# Patient Record
Sex: Female | Born: 1990 | Race: Black or African American | Hispanic: No | Marital: Single | Smoking: Current every day smoker
Health system: Southern US, Community
[De-identification: ages and names within clinical notes are randomized; demographics above are authoritative.]

---

## 2008-12-27 ENCOUNTER — Inpatient Hospital Stay (HOSPITAL_COMMUNITY): Admission: AD | Admit: 2008-12-27 | Discharge: 2008-12-27 | Payer: Self-pay | Admitting: Obstetrics & Gynecology

## 2011-04-01 LAB — WET PREP, GENITAL: Clue Cells Wet Prep HPF POC: NONE SEEN

## 2011-04-01 LAB — CBC
HCT: 37.5 % (ref 36.0–46.0)
Hemoglobin: 12.6 g/dL (ref 12.0–15.0)
MCHC: 33.7 g/dL (ref 30.0–36.0)
MCV: 92.7 fL (ref 78.0–100.0)
Platelets: 342 10*3/uL (ref 150–400)
RDW: 12.3 % (ref 11.5–15.5)

## 2011-04-01 LAB — GC/CHLAMYDIA PROBE AMP, GENITAL: GC Probe Amp, Genital: NEGATIVE

## 2012-04-18 ENCOUNTER — Encounter (HOSPITAL_COMMUNITY): Payer: Self-pay | Admitting: *Deleted

## 2012-04-18 ENCOUNTER — Emergency Department (HOSPITAL_COMMUNITY)
Admission: EM | Admit: 2012-04-18 | Discharge: 2012-04-18 | Disposition: A | Discharge status: Home / Self Care | Payer: No Typology Code available for payment source | Attending: Emergency Medicine | Admitting: Emergency Medicine

## 2012-04-18 DIAGNOSIS — K089 Disorder of teeth and supporting structures, unspecified: Secondary | ICD-10-CM | POA: Insufficient documentation

## 2012-04-18 DIAGNOSIS — K011 Impacted teeth: Secondary | ICD-10-CM

## 2012-04-18 DIAGNOSIS — K006 Disturbances in tooth eruption: Secondary | ICD-10-CM | POA: Insufficient documentation

## 2012-04-18 MED ORDER — TRAMADOL HCL 50 MG PO TABS
50.0000 mg | ORAL_TABLET | Freq: Once | ORAL | Status: AC
  Administered 2012-04-18: 50 mg via ORAL
  Filled 2012-04-18: qty 1

## 2012-04-18 MED ORDER — TRAMADOL HCL 50 MG PO TABS: 50.0000 mg | ORAL_TABLET | Freq: Three times a day (TID) | ORAL | Status: AC | PRN

## 2012-04-18 NOTE — ED Provider Notes (Addendum)
History     CSN: [id number]  Arrival date & time 04/18/12  0430   First MD Initiated Contact with Patient 04/18/12 0451      Chief Complaint  Patient presents with  . Dental Pain    L side, upper and lower    (Consider location/radiation/quality/duration/timing/severity/associated sxs/prior treatment) HPI Comments: Dental pain for several days radiating to lower jaw and ear   The history is provided by the patient.    History reviewed. No pertinent past medical history.  History reviewed. No pertinent past surgical history.  No family history on file.  History  Substance Use Topics  . Smoking status: Not on file  . Smokeless tobacco: Not on file  . Alcohol Use: Yes    OB History    Grav Para Term Preterm Abortions TAB SAB Ect Mult Living                  Review of Systems  HENT: Positive for ear pain and dental problem.     Allergies  Motrin  Home Medications   Current Outpatient Rx  Name Route Sig Dispense Refill  . ASPIRIN 325 MG PO TABS Oral Take 325 mg by mouth every 6 (six) hours as needed. For pain    . HYDROCODONE-ACETAMINOPHEN 5-500 MG PO TABS Oral Take 1 tablet by mouth every 6 (six) hours as needed. For pain    . TRAMADOL HCL 50 MG PO TABS Oral Take 1 tablet (50 mg total) by mouth every 8 (eight) hours as needed for pain. 30 tablet 0    BP 120/67  Pulse 68  Temp(Src) 98.3 F (36.8 C) (Oral)  Resp 16  SpO2 100%  LMP 04/12/2012  Physical Exam  Constitutional: She appears well-developed.  HENT:  Head: Normocephalic.  Mouth/Throat:    Cardiovascular: Normal rate.     ED Course  Procedures (including critical care time)  Labs Reviewed - No data to display No results found.   1. Impacted third molar tooth       MDM  Impacted molar        Arman Filter, NP 04/18/12 0535  Arman Filter, NP 06/18/12 2112

## 2012-04-18 NOTE — ED Provider Notes (Signed)
Medical screening examination/treatment/procedure(s) were performed by non-physician practitioner and as supervising physician I was immediately available for consultation/collaboration.  Olivia Mackie, MD 04/18/12 [phone]

## 2012-04-18 NOTE — ED Notes (Signed)
Pt c/o L sided dental pain, upper and lower jaw, radiating to ear.

## 2012-04-18 NOTE — Discharge Instructions (Signed)
Impacted Molar Molars are the teeth in the back of your mouth. You have 12 molars. There are 6 molars in each jaw, 3 on each side. When they grow in (erupt) they sometimes cause problems. Molars trapped inside the gum are impacted molars. Impacted molars may grow sideways, tilted, or may only partially emerge. Molars erupt at different times in life. The first set of molars usually erupts around 6 to 23 years of age. The second set of molars typically erupts around 11 to 23 years of age. The third set of molars are called wisdom teeth. These molars usually do not have enough space to erupt properly. Many teens and young adults develop impacted wisdom teeth and have them surgically removed (extracted). However, any molar or set of molars may become impacted. CAUSES  Teeth that are crowded are often the reason for an impacted molar, but sometimes a cyst or tumor may cause impaction of molars. SYMPTOMS  Sometimes there are no symptoms and an impacted molar is noticed during an exam or X-ray. If there are symptoms they may include:  Pain.   Swelling, redness, or inflammation near the impacted tooth or teeth.   Stiff jaw.   General feeling of illness.   Bad breath.   Gap between the teeth.   Difficulty opening your mouth.   Headache or jaw ache.   Swollen lymph nodes.  Impacted teeth may increase the risk of complications such as:  Infection, with possible drainage around the infected area.   Damage to nearby teeth.   Growth of cysts.   Chronic discomfort.  DIAGNOSIS  Impacted molars are diagnosed by oral exam and X-rays. TREATMENT  The goal of treatment is to obtain the best possible arrangement of your teeth. Your dentist or orthodontist will recommend the best course of action for you. After an exam, your caregiver may recommend one or a combination of the following treatments.  Supportive home care to manage pain and other symptoms until treatment can be started.   Surgical  extraction of one or a combination of molars to leave room for emerging or later molars. Teeth must be extracted at appropriate times for the best results.   Surgical uncovering of tissue covering the impacted molar.   Orthodontic repositioning with the use of appliances such as elastic or metal separators, braces, wires, springs, and other removable or fixed devices. This is done to guide the molar and surrounding teeth to grow in properly. In some cases, you may need some surgery to assist this procedure. Follow-up orthodontic treatment is often necessary with impacted first and second molars.   Antibiotics to treat infection.  HOME CARE INSTRUCTIONS Rinse as directed with an antibacterial solution or salt and warm water. Follow up with your caregiver as directed, even if you do not have symptoms. If you are waiting for treatment and have pain:  Take pain medicines as directed.   Take your antibiotics as directed. Finish them even if you start to feel better.   Put ice on the affected area.   Put ice in a plastic bag.   Place a towel between your skin and the bag.   Leave the ice on for 15 to 20 minutes, 3 to 4 times a day.  SEEK DENTAL CARE IF:  You have a fever.   Pain emerges, worsens, or is not controlled by the medicines you were given.   Swelling occurs.   You have difficulty opening your mouth or swallowing.  MAKE SURE YOU:     Understand these instructions.   Will watch your condition.   Will get help right away if you are not doing well or get worse.  Document Released: 07/31/2011 Document Revised: 11/21/2011 Document Reviewed: 07/31/2011 [facility] Patient Information 2012 [place], [place]. On Monday.  Call your dentist to make an appointment to have the impacted wisdom tooth extracted RESOURCE GUIDE  Dental Problems  Patients with Medicaid: [facility]                     [phone] [address].                                           Phone:   [phone]                                                  If unable to pay or uninsured, contact:  [facility] or [facility]. to become qualified for the adult dental clinic.  Chronic Pain Problems Contact [facility]  [phone] Patients need to be referred by their primary care doctor.  Insufficient Money for Medicine Contact [employer]:  call "211" or [facility] 336-191-0032.  No Primary Care Doctor Call [facility]  [phone] Other agencies that provide inexpensive medical care    [facility]  [phone]    [facility]  [phone]    [facility]  520-800-1163    [facility]  [phone]    [facility]  [phone]    [facility]  [phone]  Substance Abuse Resources [facility]  [phone] [facility] 772-299-8113 [facility] [phone] Floydene Flock [phone] Residential & Outpatient Substance Abuse Program  (216)581-1156  Psychological Services [facility]  [phone] [facility]  [phone] [facility]   [phone] (emergency services [phone])  Abuse/Neglect [facility] Child Abuse Hotline 406-396-2333 [facility] Child Abuse Hotline 220-298-2156 (After Hours)  Emergency Shelter [facility] 228-817-8587  Maternity Homes [facility] at the [place] [facility] [place] 308-570-1007 [facility] (220) [phone]  MRSA Hotline #:   207-128-1276    [facility] Resources  [facility]  [employer]                           [facility] Dept. [address] [place]                     [address]         [address]  [place]                                               Cristobal Goldmann Phone:  [phone]  Phone:  [phone]                   Phone:   [phone]  [facility] Phone:  [phone]  [facility] Child Abuse Hotline [phone] (After Hours)

## 2012-06-19 NOTE — ED Provider Notes (Signed)
Medical screening examination/treatment/procedure(s) were performed by non-physician practitioner and as supervising physician I was immediately available for consultation/collaboration.  Olivia Mackie, MD 06/19/12 [phone]

## 2013-10-23 ENCOUNTER — Emergency Department (HOSPITAL_BASED_OUTPATIENT_CLINIC_OR_DEPARTMENT_OTHER): Payer: No Typology Code available for payment source

## 2013-10-23 ENCOUNTER — Encounter (HOSPITAL_BASED_OUTPATIENT_CLINIC_OR_DEPARTMENT_OTHER): Payer: Self-pay | Admitting: Emergency Medicine

## 2013-10-23 ENCOUNTER — Emergency Department (HOSPITAL_BASED_OUTPATIENT_CLINIC_OR_DEPARTMENT_OTHER)
Admission: EM | Admit: 2013-10-23 | Discharge: 2013-10-23 | Disposition: A | Discharge status: Home / Self Care | Payer: No Typology Code available for payment source | Attending: Emergency Medicine | Admitting: Emergency Medicine

## 2013-10-23 DIAGNOSIS — S0993XA Unspecified injury of face, initial encounter: Secondary | ICD-10-CM | POA: Insufficient documentation

## 2013-10-23 DIAGNOSIS — M542 Cervicalgia: Secondary | ICD-10-CM

## 2013-10-23 DIAGNOSIS — IMO0002 Reserved for concepts with insufficient information to code with codable children: Secondary | ICD-10-CM | POA: Insufficient documentation

## 2013-10-23 DIAGNOSIS — Y9389 Activity, other specified: Secondary | ICD-10-CM | POA: Insufficient documentation

## 2013-10-23 DIAGNOSIS — F17200 Nicotine dependence, unspecified, uncomplicated: Secondary | ICD-10-CM | POA: Insufficient documentation

## 2013-10-23 DIAGNOSIS — Y92410 Unspecified street and highway as the place of occurrence of the external cause: Secondary | ICD-10-CM | POA: Insufficient documentation

## 2013-10-23 MED ORDER — CYCLOBENZAPRINE HCL 10 MG PO TABS: 10.0000 mg | ORAL_TABLET | Freq: Two times a day (BID) | ORAL | Status: AC | PRN

## 2013-10-23 MED ORDER — NAPROXEN 500 MG PO TABS: 500.0000 mg | ORAL_TABLET | Freq: Two times a day (BID) | ORAL | Status: AC

## 2013-10-23 MED ORDER — NAPROXEN 250 MG PO TABS
500.0000 mg | ORAL_TABLET | Freq: Once | ORAL | Status: AC
  Administered 2013-10-23: 500 mg via ORAL
  Filled 2013-10-23: qty 2
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
## 2013-10-25 NOTE — ED Provider Notes (Signed)
EKG Interpretation   None        Derwood Kaplan, MD 10/25/13 2312

## 2014-02-22 ENCOUNTER — Emergency Department (INDEPENDENT_AMBULATORY_CARE_PROVIDER_SITE_OTHER)
Admission: EM | Admit: 2014-02-22 | Discharge: 2014-02-22 | Disposition: A | Discharge status: Home / Self Care | Payer: Self-pay | Source: Home / Self Care | Attending: Family Medicine | Admitting: Family Medicine

## 2014-02-22 ENCOUNTER — Encounter (HOSPITAL_COMMUNITY): Payer: Self-pay | Admitting: Emergency Medicine

## 2014-02-22 DIAGNOSIS — L739 Follicular disorder, unspecified: Secondary | ICD-10-CM

## 2014-02-22 DIAGNOSIS — L678 Other hair color and hair shaft abnormalities: Secondary | ICD-10-CM

## 2014-02-22 DIAGNOSIS — L738 Other specified follicular disorders: Secondary | ICD-10-CM

## 2014-02-22 MED ORDER — MUPIROCIN CALCIUM 2 % EX CREA: 1.0000 "application " | TOPICAL_CREAM | Freq: Three times a day (TID) | CUTANEOUS | Status: AC

## 2014-02-22 NOTE — ED Notes (Signed)
Pt reports  She has  A knot on r  Side  bikinia   Line  That  She  Noticed  sev  Weeks  Ago   She  Has    A   History  Of  Skin  Infections     In past      She  Also  Reports  Symptoms  Of  Sinus  Congestion /  Cold  Symptoms

## 2014-02-22 NOTE — ED Provider Notes (Signed)
CSN: [id number]     Arrival date & time 02/22/14  1010 History   First MD Initiated Contact with Patient 02/22/14 1049     Chief Complaint  Patient presents with  . Recurrent Skin Infections   (Consider location/radiation/quality/duration/timing/severity/associated sxs/prior Treatment) Patient is a 23 y.o. female presenting with abscess. The history is provided by the patient.  Abscess Location:  Ano-genital Ano-genital abscess location:  Groin Abscess quality: induration and painful   Abscess quality: not draining and no fluctuance   Red streaking: no   Duration:  3 weeks Progression:  Improving Pain details:    Severity:  Mild   Progression:  Waxing and waning Chronicity:  New Associated symptoms: no fever     History reviewed. No pertinent past medical history. History reviewed. No pertinent past surgical history. History reviewed. No pertinent family history. History  Substance Use Topics  . Smoking status: Current Every Day Smoker -- 0.00 packs/day    Types: Cigarettes  . Smokeless tobacco: Not on file  . Alcohol Use: Yes   OB History   Grav Para Term Preterm Abortions TAB SAB Ect Mult Living                 Review of Systems  Constitutional: Negative.  Negative for fever.  Gastrointestinal: Negative.   Skin: Positive for rash.    Allergies  Motrin  Home Medications   Current Outpatient Rx  Name  Route  Sig  Dispense  Refill  . cyclobenzaprine (FLEXERIL) 10 MG tablet   Oral   Take 1 tablet (10 mg total) by mouth 2 (two) times daily as needed for muscle spasms.   10 tablet   0   . mupirocin cream (BACTROBAN) 2 %   Topical   Apply 1 application topically 3 (three) times daily. After warm soaks to area.   30 g   1   . naproxen (NAPROSYN) 500 MG tablet   Oral   Take 1 tablet (500 mg total) by mouth 2 (two) times daily with a meal.   30 tablet   0    BP 134/71  Pulse 78  Temp(Src) 98 F (36.7 C) (Oral)  Resp 16  SpO2 100%  LMP  02/22/2014 Physical Exam  Nursing note and vitals reviewed. Constitutional: She appears well-developed and well-nourished.  Skin: Skin is warm and dry. Rash noted.  Resolving indurated papule to right pubic area, from shaving and chaffing of hairs.    ED Course  Procedures (including critical care time) Labs Review Labs Reviewed - No data to display Imaging Review No results found.   MDM   1. Folliculitis of perineum        Linna HoffJames D Marvel Mcphillips, MD 02/22/14 1302

## 2014-02-22 NOTE — Discharge Instructions (Signed)
Warm compress twice a day when you apply the antibiotic, take all of medicine, return as needed. °
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
## 2015-06-14 ENCOUNTER — Encounter (HOSPITAL_COMMUNITY): Payer: Self-pay | Admitting: Emergency Medicine

## 2015-06-14 ENCOUNTER — Emergency Department (HOSPITAL_COMMUNITY)
Admission: EM | Admit: 2015-06-14 | Discharge: 2015-06-14 | Disposition: A | Discharge status: Home / Self Care | Payer: 59 | Attending: Emergency Medicine | Admitting: Emergency Medicine

## 2015-06-14 DIAGNOSIS — Z3202 Encounter for pregnancy test, result negative: Secondary | ICD-10-CM | POA: Insufficient documentation

## 2015-06-14 DIAGNOSIS — N939 Abnormal uterine and vaginal bleeding, unspecified: Secondary | ICD-10-CM

## 2015-06-14 DIAGNOSIS — N898 Other specified noninflammatory disorders of vagina: Secondary | ICD-10-CM | POA: Insufficient documentation

## 2015-06-14 DIAGNOSIS — R109 Unspecified abdominal pain: Secondary | ICD-10-CM

## 2015-06-14 DIAGNOSIS — Z720 Tobacco use: Secondary | ICD-10-CM | POA: Diagnosis not present

## 2015-06-14 DIAGNOSIS — R1030 Lower abdominal pain, unspecified: Secondary | ICD-10-CM | POA: Diagnosis present

## 2015-06-14 LAB — COMPREHENSIVE METABOLIC PANEL
ALBUMIN: 4.6 g/dL (ref 3.5–5.0)
ALK PHOS: 42 U/L (ref 38–126)
ALT: 12 U/L — ABNORMAL LOW (ref 14–54)
ANION GAP: 8 (ref 5–15)
AST: 17 U/L (ref 15–41)
BUN: 11 mg/dL (ref 6–20)
CALCIUM: 9.4 mg/dL (ref 8.9–10.3)
CHLORIDE: 104 mmol/L (ref 101–111)
CO2: 29 mmol/L (ref 22–32)
CREATININE: 0.76 mg/dL (ref 0.44–1.00)
GFR calc Af Amer: >60 mL/min (ref >60)
GFR calc non Af Amer: >60 mL/min (ref >60)
GLUCOSE: 95 mg/dL (ref 65–99)
Potassium: 3.5 mmol/L (ref 3.5–5.1)
Sodium: 141 mmol/L (ref 135–145)
TOTAL PROTEIN: 7.5 g/dL (ref 6.5–8.1)
Total Bilirubin: 0.7 mg/dL (ref 0.3–1.2)

## 2015-06-14 LAB — WET PREP, GENITAL
Trich, Wet Prep: NONE SEEN
YEAST WET PREP: NONE SEEN

## 2015-06-14 LAB — URINALYSIS, ROUTINE W REFLEX MICROSCOPIC
Bilirubin Urine: NEGATIVE
Glucose, UA: NEGATIVE mg/dL
KETONES UR: NEGATIVE mg/dL
Nitrite: NEGATIVE
PROTEIN: 30 mg/dL — AB
Specific Gravity, Urine: 1.022 (ref 1.005–1.030)
UROBILINOGEN UA: 1 mg/dL (ref 0.0–1.0)
pH: 6 (ref 5.0–8.0)

## 2015-06-14 LAB — I-STAT BETA HCG BLOOD, ED (MC, WL, AP ONLY): I-stat hCG, quantitative: <5 m[IU]/mL (ref <5)

## 2015-06-14 LAB — CBC WITH DIFFERENTIAL/PLATELET
BASOS ABS: 0 10*3/uL (ref 0.0–0.1)
Basophils Relative: 0 % (ref 0–1)
Eosinophils Absolute: 0.3 10*3/uL (ref 0.0–0.7)
Eosinophils Relative: 5 % (ref 0–5)
HEMATOCRIT: 38 % (ref 36.0–46.0)
HEMOGLOBIN: 13.1 g/dL (ref 12.0–15.0)
LYMPHS PCT: 38 % (ref 12–46)
Lymphs Abs: 2.2 10*3/uL (ref 0.7–4.0)
MCH: 30.3 pg (ref 26.0–34.0)
MCHC: 34.5 g/dL (ref 30.0–36.0)
MCV: 87.8 fL (ref 78.0–100.0)
MONO ABS: 0.6 10*3/uL (ref 0.1–1.0)
Monocytes Relative: 11 % (ref 3–12)
NEUTROS ABS: 2.6 10*3/uL (ref 1.7–7.7)
Neutrophils Relative %: 46 % (ref 43–77)
Platelets: 337 10*3/uL (ref 150–400)
RBC: 4.33 MIL/uL (ref 3.87–5.11)
RDW: 12 % (ref 11.5–15.5)
WBC: 5.7 10*3/uL (ref 4.0–10.5)

## 2015-06-14 LAB — LIPASE, BLOOD: Lipase: 15 U/L — ABNORMAL LOW (ref 22–51)

## 2015-06-14 LAB — URINE MICROSCOPIC-ADD ON

## 2015-06-14 LAB — GC/CHLAMYDIA PROBE AMP (~~LOC~~) NOT AT ARMC
CHLAMYDIA, DNA PROBE: NEGATIVE
NEISSERIA GONORRHEA: NEGATIVE

## 2015-06-14 MED ORDER — HYDROCODONE-ACETAMINOPHEN 5-325 MG PO TABS
1.0000 | ORAL_TABLET | Freq: Once | ORAL | Status: AC
  Administered 2015-06-14: 1 via ORAL
  Filled 2015-06-14: qty 1

## 2015-06-14 MED ORDER — KETOROLAC TROMETHAMINE 60 MG/2ML IM SOLN
60.0000 mg | Freq: Once | INTRAMUSCULAR | Status: AC
  Administered 2015-06-14: 60 mg via INTRAMUSCULAR
  Filled 2015-06-14: qty 2

## 2015-06-14 MED ORDER — NAPROXEN 500 MG PO TABS: 500.0000 mg | ORAL_TABLET | Freq: Two times a day (BID) | ORAL | Status: AC

## 2015-06-14 MED ORDER — TRAMADOL HCL 50 MG PO TABS: 50.0000 mg | ORAL_TABLET | Freq: Four times a day (QID) | ORAL | Status: AC | PRN

## 2015-06-14 NOTE — ED Notes (Signed)
PA at bedside.

## 2015-06-14 NOTE — Discharge Instructions (Signed)
Try heating pads. Naprosyn for pain as needed. Tramadol for severe pain. Follow up with primary care doctor. Return if worsening symptoms.    Abdominal Pain, Women Abdominal (stomach, pelvic, or belly) pain can be caused by many things. It is important to tell your doctor:  The location of the pain.  Does it come and go or is it present all the time?  Are there things that start the pain (eating certain foods, exercise)?  Are there other symptoms associated with the pain (fever, nausea, vomiting, diarrhea)? All of this is helpful to know when trying to find the cause of the pain. CAUSES   Stomach: virus or bacteria infection, or ulcer.  Intestine: appendicitis (inflamed appendix), regional ileitis (Crohn's disease), ulcerative colitis (inflamed colon), irritable bowel syndrome, diverticulitis (inflamed diverticulum of the colon), or cancer of the stomach or intestine.  Gallbladder disease or stones in the gallbladder.  Kidney disease, kidney stones, or infection.  Pancreas infection or cancer.  Fibromyalgia (pain disorder).  Diseases of the female organs:  Uterus: fibroid (non-cancerous) tumors or infection.  Fallopian tubes: infection or tubal pregnancy.  Ovary: cysts or tumors.  Pelvic adhesions (scar tissue).  Endometriosis (uterus lining tissue growing in the pelvis and on the pelvic organs).  Pelvic congestion syndrome (female organs filling up with blood just before the menstrual period).  Pain with the menstrual period.  Pain with ovulation (producing an egg).  Pain with an IUD (intrauterine device, birth control) in the uterus.  Cancer of the female organs.  Functional pain (pain not caused by a disease, may improve without treatment).  Psychological pain.  Depression. DIAGNOSIS  Your doctor will decide the seriousness of your pain by doing an examination.  Blood tests.  X-rays.  Ultrasound.  CT scan (computed tomography, special type of  X-ray).  MRI (magnetic resonance imaging).  Cultures, for infection.  Barium enema (dye inserted in the large intestine, to better view it with X-rays).  Colonoscopy (looking in intestine with a lighted tube).  Laparoscopy (minor surgery, looking in abdomen with a lighted tube).  Major abdominal exploratory surgery (looking in abdomen with a large incision). TREATMENT  The treatment will depend on the cause of the pain.   Many cases can be observed and treated at home.  Over-the-counter medicines recommended by your caregiver.  Prescription medicine.  Antibiotics, for infection.  Birth control pills, for painful periods or for ovulation pain.  Hormone treatment, for endometriosis.  Nerve blocking injections.  Physical therapy.  Antidepressants.  Counseling with a psychologist or psychiatrist.  Minor or major surgery. HOME CARE INSTRUCTIONS   Do not take laxatives, unless directed by your caregiver.  Take over-the-counter pain medicine only if ordered by your caregiver. Do not take aspirin because it can cause an upset stomach or bleeding.  Try a clear liquid diet (broth or water) as ordered by your caregiver. Slowly move to a bland diet, as tolerated, if the pain is related to the stomach or intestine.  Have a thermometer and take your temperature several times a day, and record it.  Bed rest and sleep, if it helps the pain.  Avoid sexual intercourse, if it causes pain.  Avoid stressful situations.  Keep your follow-up appointments and tests, as your caregiver orders.  If the pain does not go away with medicine or surgery, you may try:  Acupuncture.  Relaxation exercises (yoga, meditation).  Group therapy.  Counseling. SEEK MEDICAL CARE IF:   You notice certain foods cause stomach pain.  Your  home care treatment is not helping your pain.  You need stronger pain medicine.  You want your IUD removed.  You feel faint or lightheaded.  You  develop nausea and vomiting.  You develop a rash.  You are having side effects or an allergy to your medicine. SEEK IMMEDIATE MEDICAL CARE IF:   Your pain does not go away or gets worse.  You have a fever.  Your pain is felt only in portions of the abdomen. The right side could possibly be appendicitis. The left lower portion of the abdomen could be colitis or diverticulitis.  You are passing blood in your stools (bright red or black tarry stools, with or without vomiting).  You have blood in your urine.  You develop chills, with or without a fever.  You pass out. MAKE SURE YOU:   Understand these instructions.  Will watch your condition.  Will get help right away if you are not doing well or get worse. Document Released: 09/29/2007 Document Revised: 04/18/2014 Document Reviewed: 10/19/2009 [facility] Patient Information 2015 [place], [place]. This information is not intended to replace advice given to you by your health care provider. Make sure you discuss any questions you have with your health care provider.

## 2015-06-14 NOTE — ED Provider Notes (Signed)
CSN: [id number]     Arrival date & time 06/14/15  0118 History   First MD Initiated Contact with Patient 06/14/15 [phone]     Chief Complaint  Patient presents with  . Abdominal Pain  . Vaginal Bleeding     (Consider location/radiation/quality/duration/timing/severity/associated sxs/prior Treatment) HPI Alexis Todd is a 24 y.o. female with no medical problems, presents to ED with complaint of lower abdominal cramping that started last night. Pt states she also started bleeding, states "i started my cycle." reports bleeding is heavier than usual period, states camping similar but more intense. States "comes and goes like contractions." Pt voices she is worried she may have a miscarriage. Pt denies any back pain. No urinary symptoms. No fever, chills. States she had several episodes of vomiting. She does reports unusual for her vaginal discharge and mucus in toilet bowl that she showed me a picture of, prior to starting her period. Sexually active with 1 partner.   History reviewed. No pertinent past medical history. History reviewed. No pertinent past surgical history. History reviewed. No pertinent family history. History  Substance Use Topics  . Smoking status: Current Every Day Smoker -- 0.00 packs/day    Types: Cigarettes  . Smokeless tobacco: Not on file  . Alcohol Use: Yes   OB History    No data available     Review of Systems  Constitutional: Negative for fever and chills.  Respiratory: Negative for cough, chest tightness and shortness of breath.   Cardiovascular: Negative for chest pain, palpitations and leg swelling.  Gastrointestinal: Positive for nausea and abdominal pain. Negative for vomiting and diarrhea.  Genitourinary: Positive for vaginal bleeding, vaginal discharge and pelvic pain. Negative for dysuria, flank pain and vaginal pain.  Musculoskeletal: Negative for myalgias, arthralgias, neck pain and neck stiffness.  Skin: Negative for rash.  Neurological: Negative  for dizziness, weakness and headaches.  All other systems reviewed and are negative.     Allergies  Motrin  Home Medications   Prior to Admission medications   Medication Sig Start Date End Date Taking? Authorizing Provider  cyclobenzaprine (FLEXERIL) 10 MG tablet Take 1 tablet (10 mg total) by mouth 2 (two) times daily as needed for muscle spasms. Patient not taking: Reported on 06/14/2015 10/23/13   Emilia BeckKaitlyn Szekalski, PA-C  mupirocin cream (BACTROBAN) 2 % Apply 1 application topically 3 (three) times daily. After warm soaks to area. Patient not taking: Reported on 06/14/2015 02/22/14   Linna HoffJames D Kindl, MD  naproxen (NAPROSYN) 500 MG tablet Take 1 tablet (500 mg total) by mouth 2 (two) times daily with a meal. Patient not taking: Reported on 06/14/2015 10/23/13   Kaitlyn Szekalski, PA-C   BP 105/50 mmHg  Pulse 88  Temp(Src) 98.5 F (36.9 C) (Oral)  Resp 16  SpO2 100%  LMP 05/17/2015 (Approximate) Physical Exam  Constitutional: She appears well-developed and well-nourished. No distress.  HENT:  Head: Normocephalic.  Eyes: Conjunctivae are normal.  Neck: Neck supple.  Cardiovascular: Normal rate, regular rhythm and normal heart sounds.   Pulmonary/Chest: Effort normal and breath sounds normal. No respiratory distress. She has no wheezes. She has no rales.  Abdominal: Soft. Bowel sounds are normal. She exhibits no distension. There is tenderness. There is no rebound.  Suprapubic tenderness  Genitourinary:  Normal external genitalia. Normal vaginal canal. Moderate blood in vaginal canal. Cervix is normal, closed. No CMT. No uterine or adnexal tenderness. No masses palpated.    Musculoskeletal: She exhibits no edema.  Neurological: She is alert.  Skin:  Skin is warm and dry.  Psychiatric: She has a normal mood and affect. Her behavior is normal.  Nursing note and vitals reviewed.   ED Course  Procedures (including critical care time) Labs Review Labs Reviewed  WET PREP, GENITAL  - Abnormal; Notable for the following:    Clue Cells Wet Prep HPF POC FEW (*)    WBC, Wet Prep HPF POC FEW (*)    All other components within normal limits  COMPREHENSIVE METABOLIC PANEL - Abnormal; Notable for the following:    ALT 12 (*)    All other components within normal limits  LIPASE, BLOOD - Abnormal; Notable for the following:    Lipase 15 (*)    All other components within normal limits  URINALYSIS, ROUTINE W REFLEX MICROSCOPIC (NOT AT [facility]) - Abnormal; Notable for the following:    Color, Urine RED (*)    APPearance CLOUDY (*)    Hgb urine dipstick LARGE (*)    Protein, ur 30 (*)    Leukocytes, UA SMALL (*)    All other components within normal limits  URINE MICROSCOPIC-ADD ON - Abnormal; Notable for the following:    Bacteria, UA FEW (*)    All other components within normal limits  CBC WITH DIFFERENTIAL/PLATELET  I-STAT BETA HCG BLOOD, ED ([facility], [facility], [facility] ONLY)  GC/CHLAMYDIA PROBE AMP ([facility]) NOT AT [facility]    Imaging Review No results found.   EKG Interpretation None      MDM   Final diagnoses:  Abdominal cramping  Vaginal bleeding    patient with lower abdominal cramping, started vaginal bleeding which is normal time for her period as well. Last menstrual cycle was June 1 which is 28 days from today. Exam shows suprapubic tenderness, no guarding, no rebound tenderness, no adnexal tenderness on pelvic exam. Doubt ovarian torsion, doubt tubo-ovarian abscess. Blood work obtained by triage and is normal. Urinalysis with blood otherwise unremarkable. Wet prep pending. Patient received Toradol and Norco for pain. Will reassess.  7:22 AM Pain much improved. Most likely menstrual cramping. Home with naprosyn, tramadol, follow up with pcp or ob/gyn as needed.   Filed Vitals:   06/14/15 0124 06/14/15 0533 06/14/15 0729  BP: 110/69 105/50 113/77  Pulse: 60 88 77  Temp: 98.5 F (36.9 C)    TempSrc: Oral    Resp: SpO2: 100% 100% 100%     Jaynie Crumble, PA-C 06/14/15 1455  Loren Racer, MD 06/14/15 2320

## 2015-06-14 NOTE — ED Notes (Signed)
Pt requesting pain medication.  

## 2015-06-14 NOTE — ED Notes (Signed)
Pt states that today she passed blood through her vagina in an unusually large amount and has had lower abdominal cramping and N/V. Fearful that she is having a miscarriage but does not know if she was pregnant. Alert and oriented.
